# Patient Record
Sex: Female | Born: 1955 | Race: White | Hispanic: No | Smoking: Never smoker
Health system: Southern US, Community
[De-identification: ages and names within clinical notes are randomized; demographics above are authoritative.]

## PROBLEM LIST (undated history)

## (undated) DIAGNOSIS — I447 Left bundle-branch block, unspecified: Secondary | ICD-10-CM

## (undated) DIAGNOSIS — K635 Polyp of colon: Secondary | ICD-10-CM

## (undated) DIAGNOSIS — C55 Malignant neoplasm of uterus, part unspecified: Secondary | ICD-10-CM

## (undated) HISTORY — DX: Malignant neoplasm of uterus, part unspecified: C55

## (undated) HISTORY — DX: Polyp of colon: K63.5

## (undated) HISTORY — DX: Left bundle-branch block, unspecified: I44.7

---
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
## 2006-06-30 ENCOUNTER — Ambulatory Visit: Payer: Self-pay | Admitting: Obstetrics and Gynecology

## 2006-08-05 ENCOUNTER — Ambulatory Visit: Payer: Self-pay | Admitting: Obstetrics and Gynecology

## 2008-11-01 DIAGNOSIS — K635 Polyp of colon: Secondary | ICD-10-CM

## 2008-11-01 HISTORY — DX: Polyp of colon: K63.5

## 2009-07-10 ENCOUNTER — Ambulatory Visit: Payer: Self-pay | Admitting: Gastroenterology

## 2010-11-01 DIAGNOSIS — I447 Left bundle-branch block, unspecified: Secondary | ICD-10-CM

## 2010-11-01 HISTORY — DX: Left bundle-branch block, unspecified: I44.7

## 2010-11-01 HISTORY — PX: ABDOMINAL HYSTERECTOMY: SHX81

## 2012-10-31 ENCOUNTER — Telehealth: Payer: Self-pay | Admitting: Family Medicine

## 2012-10-31 NOTE — Telephone Encounter (Signed)
Dr. Dayton Martes, Patient says she spoke to you regarding becoming a new patient and per Jacki Cones we needed to verify that it's ok to add this patient in any slot other than a new patient slot.  Patient thinks she's having gallbladder issues and was hoping to be seen some time next week.  Please advise.  Thanks,  [employer]

## 2012-10-31 NOTE — Telephone Encounter (Signed)
Yes I spoke to a nurse at [facility] about seeing her mother ASAP.  If this is her, then yes please work in for a 30 min appt.

## 2012-11-13 ENCOUNTER — Ambulatory Visit (INDEPENDENT_AMBULATORY_CARE_PROVIDER_SITE_OTHER): Payer: Self-pay | Admitting: Family Medicine

## 2012-11-13 ENCOUNTER — Encounter: Payer: Self-pay | Admitting: Family Medicine

## 2012-11-13 VITALS — BP 140/84 | HR 80 | Temp 98.2°F | Ht 65.0 in | Wt 200.0 lb

## 2012-11-13 DIAGNOSIS — R1011 Right upper quadrant pain: Secondary | ICD-10-CM

## 2012-11-13 NOTE — Progress Notes (Signed)
  Subjective:    Patient ID: Erin Padilla, female    DOB: [birth year], 57 y.o.   MRN: [id number]  HPI  57 yo pt with h/o of endometrial CA s/p hysterectomy here to establish care and for RUQ pain.  She has no insurance and would like this to be a limited visit.  She is very active.  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  Past 2 months, intermittent RUQ dull, achy pain.  Does not seem to be worsened by food.  Can be worsened by sodas.   No associated nausea, vomiting, fever, blood in stool or changes in her bowel habits.  Patient Active Problem List  Diagnosis  . RUQ pain   Past Medical History  Diagnosis Date  . Colon polyp 2010  . Uterine cancer     did not penetrate wall   Past Surgical History  Procedure Date  . Abdominal hysterectomy 2012  . Tonsillectomy 1970   History  Substance Use Topics  . Smoking status: Former Games developer  . Smokeless tobacco: Never Used  . Alcohol Use: No   No family history on file. No Known Allergies No current outpatient prescriptions on file prior to visit.   The PMH, PSH, Social History, Family History, Medications, and allergies have been reviewed in [facility], and have been updated if relevant.   Review of Systems    See HPI Objective:   Physical Exam BP 140/84  Pulse 80  Temp 98.2 F (36.8 C) (Oral)  Ht 5\' 5"  (1.651 m)  Wt 200 lb (90.719 kg)  BMI 33.28 kg/m2 Gen:  Alert pleasant, NAD Abd:  Soft, NTTP Pos BS Psych:  Good eye contact.  Not anxious or depressed appearing.     Assessment & Plan:   1. RUQ pain  Comprehensive metabolic panel, Lipase, US Abdomen Limited RUQ   New- persistent.  Will start with labs and right upper quadrant ultrasound- I suspect biliary colic although not classic presentation. The patient indicates understanding of these issues and agrees with the plan.

## 2012-11-13 NOTE — Patient Instructions (Addendum)
After you to the lab, please stop by to see Shirlee Limerick on your way out to set up your ultrasound. We will call you with your lab results.  Please ask for patient assistance information on your way out.

## 2012-11-14 LAB — COMPREHENSIVE METABOLIC PANEL
ALT: 14 U/L (ref 0–35)
AST: 22 U/L (ref 0–37)
Alkaline Phosphatase: 70 U/L (ref 39–117)
BUN: 13 mg/dL (ref 6–23)
Creatinine, Ser: 0.9 mg/dL (ref 0.4–1.2)
Potassium: 3.9 mEq/L (ref 3.5–5.1)

## 2012-11-21 ENCOUNTER — Ambulatory Visit: Payer: Self-pay | Admitting: Family Medicine

## 2012-11-21 ENCOUNTER — Telehealth: Payer: Self-pay

## 2012-11-21 ENCOUNTER — Encounter: Payer: Self-pay | Admitting: Family Medicine

## 2012-11-21 NOTE — Telephone Encounter (Signed)
Erin Padilla with [facility] request order for US abdomen limited RUQ faxed to [phone]. Order faxed and Erin Padilla notified.

## 2012-11-22 ENCOUNTER — Encounter: Payer: Self-pay | Admitting: Family Medicine

## 2013-10-08 IMAGING — US US ABDOMEN LIMITED SLG ORGAN/ASCITES
1 series · 14 of 25 positions shown · non-contrast
Comparison: none

REASON FOR EXAM: RUQ pain eval gallbladder
COMMENTS:

PROCEDURE:     JOHNATHAN - JOHNATHAN ABDOMEN LTD 1 ORGAN OR QUAD  - November 21, 2012  [DATE]
RESULT:     History: Pain.
Comparison Study: No prior.

[Series 1: us abdomen limited slg organ/ascites · 0.31mm/px · 14 of 62 slices shown]
[im 1/62]
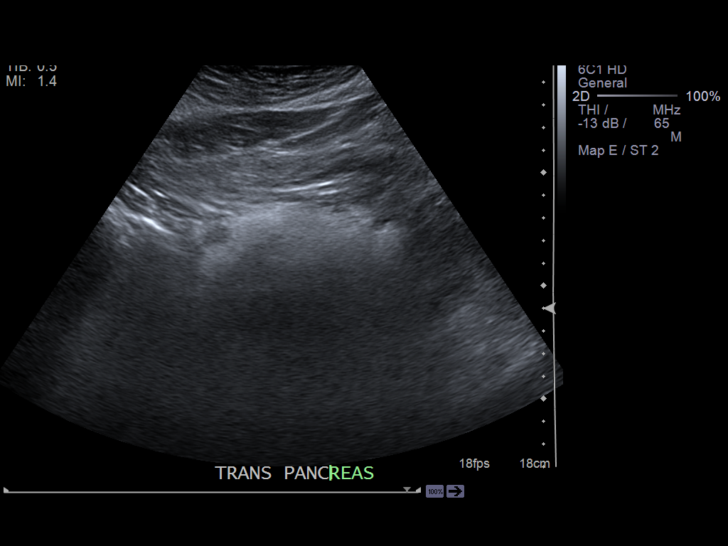
[im 6/62]
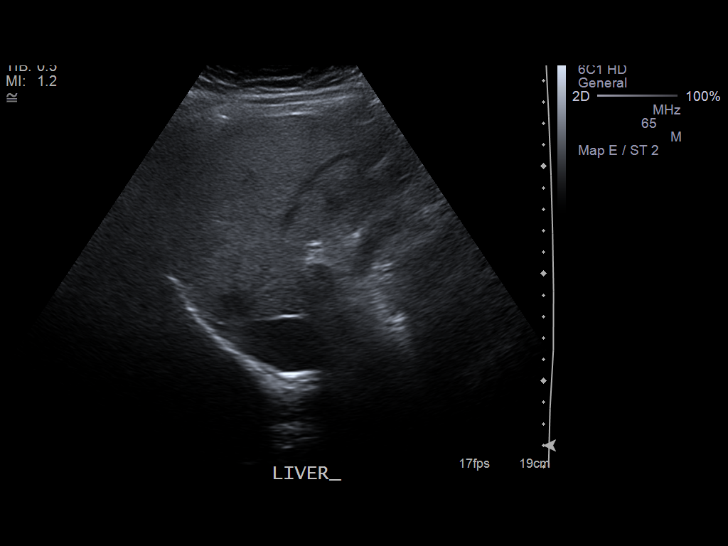
[im 11/62]
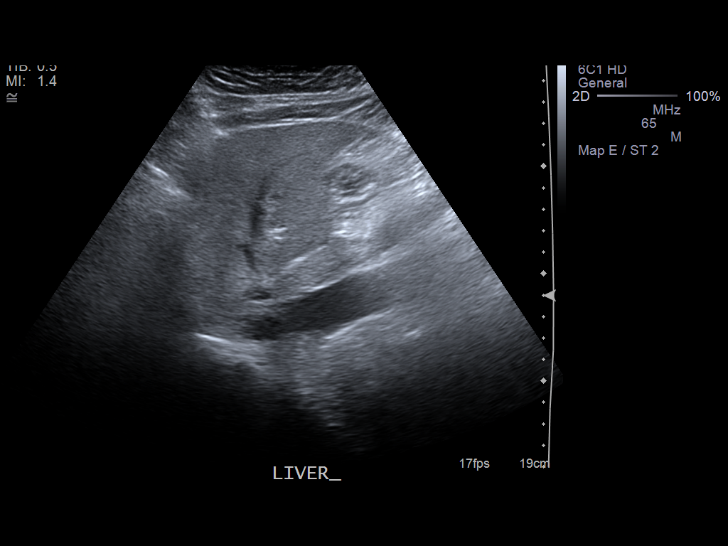
[im 16/62]
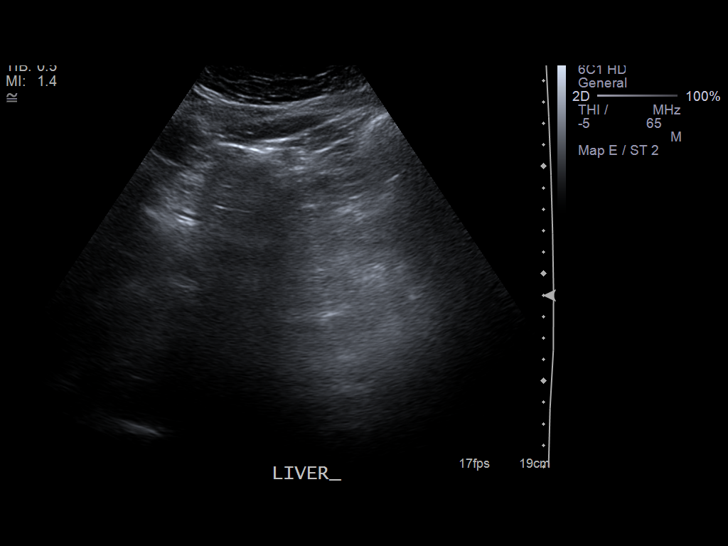
[im 21/62]
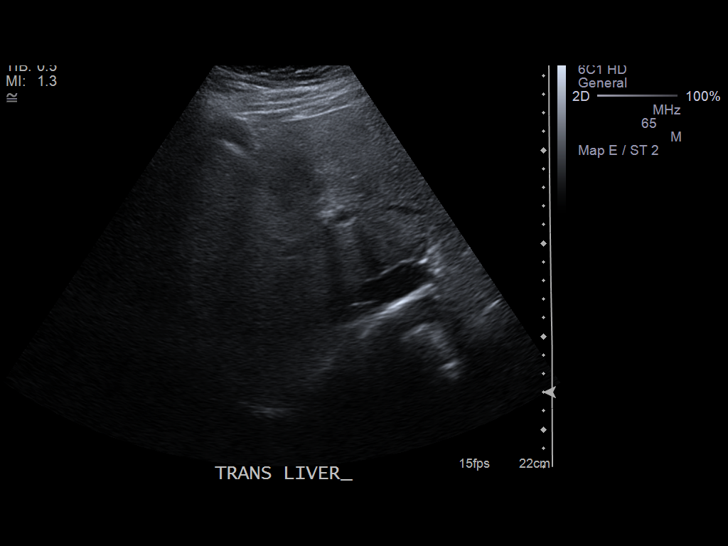
[im 23/62]
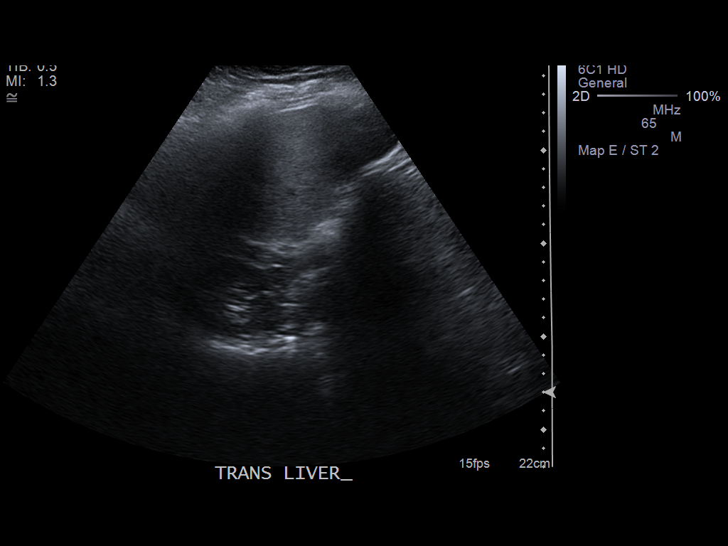
[im 28/62]
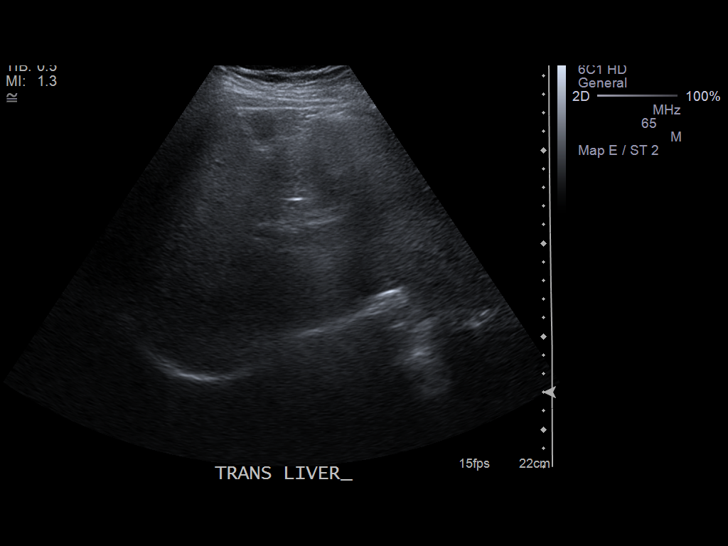
[im 34/62]
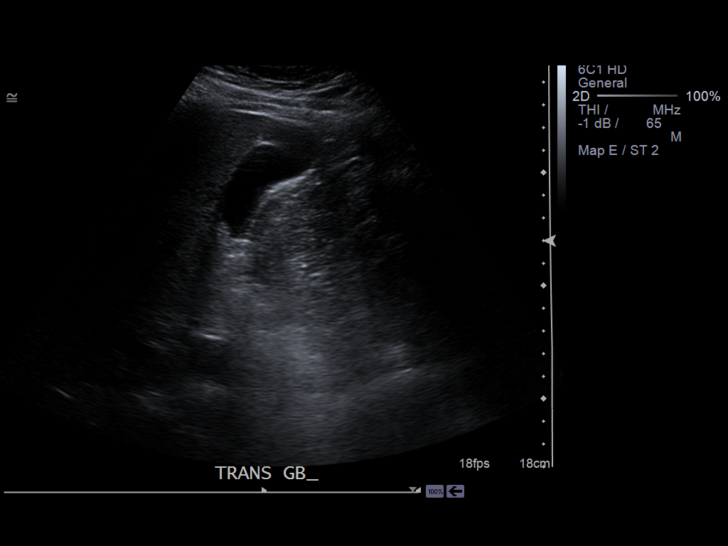
[im 39/62]
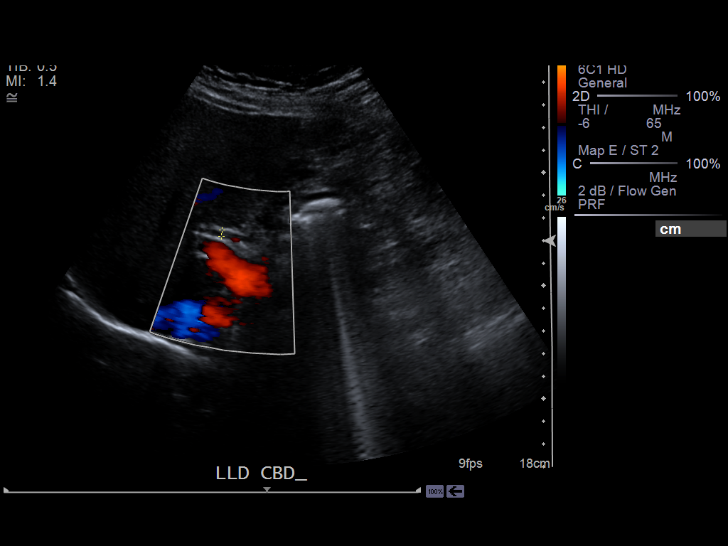
[im 41/62]
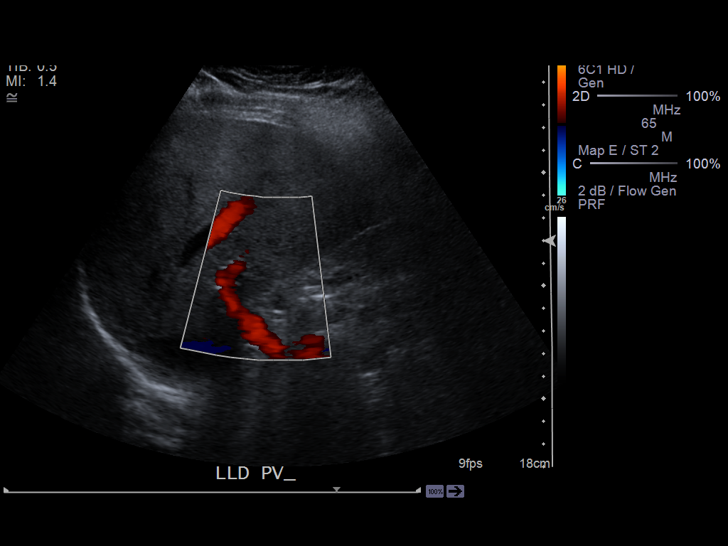
[im 46/62]
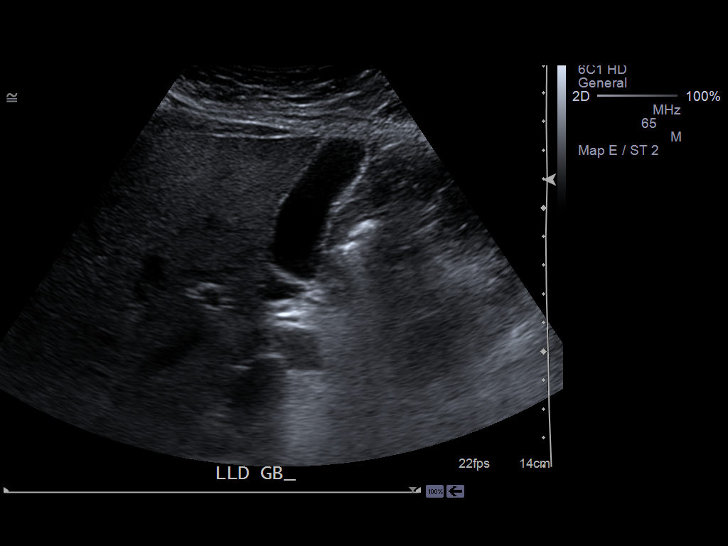
[im 51/62]
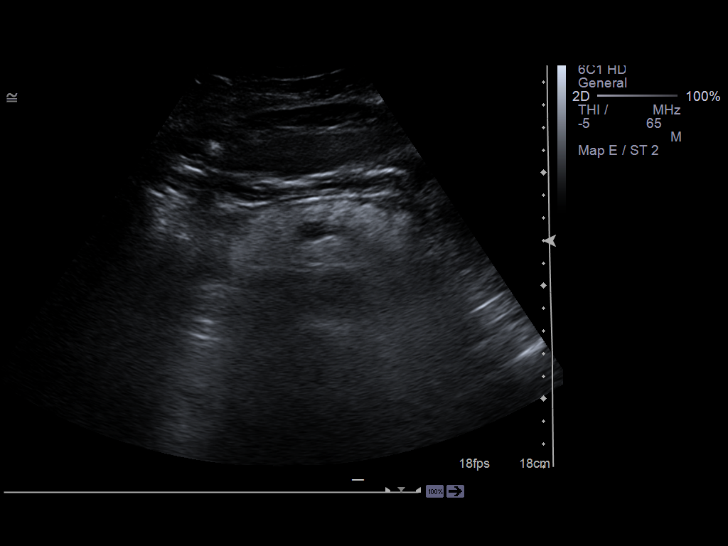
[im 56/62]
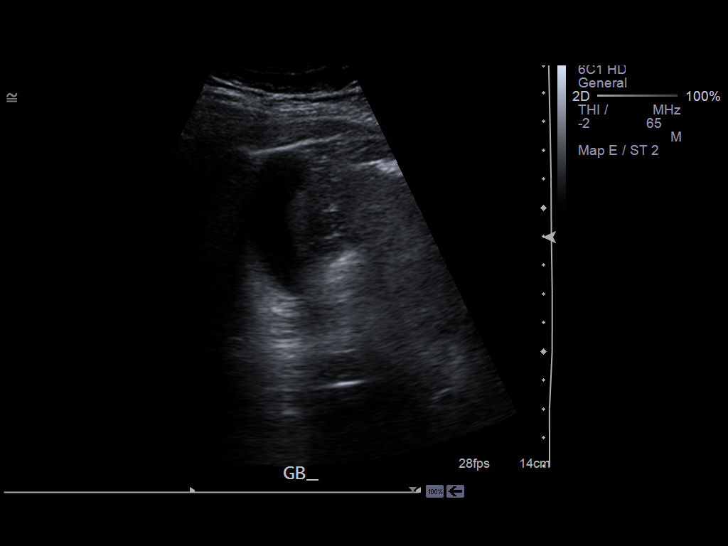
[im 62/62]
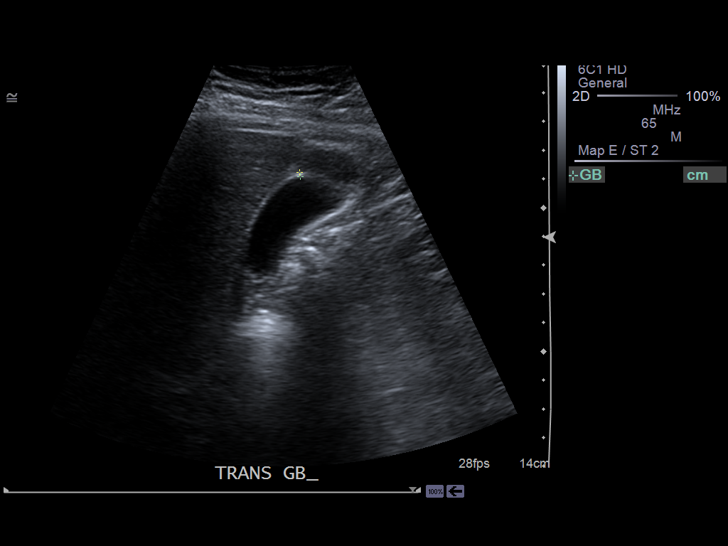

[14 of 25 positions shown; findings below may reference images not displayed]

FINDINGS: Gallbladder normal. Gallbladder wall thickness 1.5 mm. Common bile
duct diameter 2.8 mm. Negative Murphy's sign. Pancreas normal visualized.
IMPRESSION: Negative exam.

## 2014-01-17 ENCOUNTER — Ambulatory Visit (INDEPENDENT_AMBULATORY_CARE_PROVIDER_SITE_OTHER): Payer: Self-pay | Admitting: Family Medicine

## 2014-01-17 ENCOUNTER — Encounter: Payer: Self-pay | Admitting: Family Medicine

## 2014-01-17 VITALS — BP 132/82 | HR 56 | Temp 97.5°F | Ht 63.75 in | Wt 204.5 lb

## 2014-01-17 DIAGNOSIS — I447 Left bundle-branch block, unspecified: Secondary | ICD-10-CM | POA: Insufficient documentation

## 2014-01-17 DIAGNOSIS — Z01818 Encounter for other preprocedural examination: Secondary | ICD-10-CM | POA: Insufficient documentation

## 2014-01-17 NOTE — Progress Notes (Signed)
   Subjective:   Patient ID: Erin Padilla, female    DOB: 12-07-1955, 58 y.o.   MRN: [id number]  Erin Padilla is a pleasant 58 y.o. year old female who presents to clinic today with surgical clearance  on 01/17/2014  HPI: Scheduled to have right knee arthroscopic surgery.  Needs a TKR but she would like to start out with something less invasive first.  Had a hysterectomy in 09/2011- during preop, LBBB was found.  She was still cleared for surgery.  Has never had any symptoms.  Very physically active and never has dizziness, CP, SOB, DOE, orthopnea or fatigue.  Did not have any issues with anesthesia.  Former smoker.  Patient Active Problem List   Diagnosis Date Noted  . LBBB (left bundle branch block) 01/17/2014  . Pre-operative clearance 01/17/2014   Past Medical History  Diagnosis Date  . Colon polyp 2010  . Uterine cancer     did not penetrate wall  . LBBB (left bundle branch block)    Past Surgical History  Procedure Laterality Date  . Abdominal hysterectomy  2012  . Tonsillectomy  1970   History  Substance Use Topics  . Smoking status: Former Research scientist (life sciences)  . Smokeless tobacco: Never Used  . Alcohol Use: No   No family history on file. No Known Allergies No current outpatient prescriptions on file prior to visit.   No current facility-administered medications on file prior to visit.   The PMH, PSH, Social History, Family History, Medications, and allergies have been reviewed in [facility], and have been updated if relevant.   Review of Systems    See HPI +right knee pain otherwise unremarkable Objective:    BP 132/82  Pulse 56  Temp(Src) 97.5 F (36.4 C) (Oral)  Ht 5' 3.75" (1.619 m)  Wt 204 lb 8 oz (92.761 kg)  BMI 35.39 kg/m2  SpO2 98%   Physical Exam  Constitutional: She is oriented to person, place, and time. She appears well-developed and well-nourished. No distress.  HENT:  Head: Normocephalic and atraumatic.  Cardiovascular: Normal rate and regular  rhythm.   Pulmonary/Chest: Effort normal and breath sounds normal.  Neurological: She is alert and oriented to person, place, and time.  Skin: Skin is warm and dry.  Psychiatric: She has a normal mood and affect. Her behavior is normal. Thought content normal.      EKG-LBBB with bradycardia    Assessment & Plan:   Preoperative clearance - Plan: EKG 12-Lead  LBBB (left bundle branch block) - Plan: Ambulatory referral to Cardiology  Pre-operative clearance No Follow-up on file.

## 2014-01-17 NOTE — Progress Notes (Signed)
Pre visit review using our clinic review tool, if applicable. No additional management support is needed unless otherwise documented below in the visit note. 

## 2014-01-17 NOTE — Patient Instructions (Signed)
It was great to see you. We will fax my note and EKG to your surgeon.  We will call you with your heart doctor appointment.

## 2014-01-17 NOTE — Assessment & Plan Note (Signed)
Asymptomatic and per pt, present on EKG in 2012.  Will refer to cardiology for evaluation at some point- does not need to be prior to surgery since she had no issues with general anesthesia in past and she is quite physically active. The patient indicates understanding of these issues and agrees with the plan.

## 2014-01-17 NOTE — Assessment & Plan Note (Signed)
Cleared for surgery. See above.

## 2014-01-23 ENCOUNTER — Ambulatory Visit (INDEPENDENT_AMBULATORY_CARE_PROVIDER_SITE_OTHER): Payer: Self-pay | Admitting: Cardiology

## 2014-01-23 ENCOUNTER — Encounter: Payer: Self-pay | Admitting: Cardiology

## 2014-01-23 VITALS — BP 153/89 | HR 67 | Ht 63.0 in | Wt 205.8 lb

## 2014-01-23 DIAGNOSIS — I429 Cardiomyopathy, unspecified: Secondary | ICD-10-CM

## 2014-01-23 DIAGNOSIS — Z01818 Encounter for other preprocedural examination: Secondary | ICD-10-CM

## 2014-01-23 DIAGNOSIS — I447 Left bundle-branch block, unspecified: Secondary | ICD-10-CM

## 2014-01-23 DIAGNOSIS — I428 Other cardiomyopathies: Secondary | ICD-10-CM

## 2014-01-23 NOTE — Patient Instructions (Signed)
Your physician has requested that you have an echocardiogram. Echocardiography is a painless test that uses sound waves to create images of your heart. It provides your doctor with information about the size and shape of your heart and how well your heart's chambers and valves are working. This procedure takes approximately one hour. There are no restrictions for this procedure.  Your physician recommends that you schedule a follow-up appointment in:  After ECHO

## 2014-01-24 ENCOUNTER — Telehealth: Payer: Self-pay

## 2014-01-24 ENCOUNTER — Encounter: Payer: Self-pay | Admitting: Cardiology

## 2014-01-24 DIAGNOSIS — I429 Cardiomyopathy, unspecified: Secondary | ICD-10-CM | POA: Insufficient documentation

## 2014-01-24 NOTE — Progress Notes (Signed)
PATIENT: Erin Padilla MRN: [id number] DOB: 01-Mar-1956 PCP: Arnette Norris, MD  Clinic Note: Chief Complaint  Patient presents with  . OTHER    Ref by Deborra Medina due to LBBB c/o no complaints. Meds reviewed verbally with pt.   HPI: Erin Padilla is a 58 y.o. female with a PMH below who presents today for evaluation of LBBB on EKG.  Interval History: [name] is essentially asymptomatic and overall for cardiac standpoint. She has had a Left Bundle Branch Block diagnosed in 2012 at for preop evaluation [facility]. No evaluation was done after that. Up until the time that she started being bothered by her knee, she was quite active and denies any major cardiac symptoms.  From a cardiac standpoint: No chest pain or shortness of breath with rest or exertion. No PND, orthopnea or edema. No palpitations, lightheadedness, dizziness, weakness or syncope/near syncope. No TIA/amaurosis fugax symptoms. No melena, hematochezia hematuria. No claudication    Past Medical History  Diagnosis Date  . Colon polyp 2010  . Uterine cancer     did not penetrate wall  . LBBB (left bundle branch block) 2012    Prior Cardiac Evaluation and Past Surgical History: Past Surgical History  Procedure Laterality Date  . Abdominal hysterectomy  2012  . Tonsillectomy  1970    No Known Allergies  Current Outpatient Prescriptions  Medication Sig Dispense Refill  . b complex vitamins tablet Take 1 tablet by mouth daily.      Marland Kitchen ibuprofen (ADVIL,MOTRIN) 800 MG tablet Take 800 mg by mouth 2 (two) times daily as needed.       No current facility-administered medications for this visit.    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   Married.   Never smoked. No EtOH.   Prior to worsening Knee pain from OA - would walk & exercise regularly. 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     Family History: family history includes Arrhythmia in her father; Hypertension in her  father.  ROS: A comprehensive Review of Systems - Negative except Lots of stress at work. Rare palpitations. significant right knee pain that notably limits activity.   PHYSICAL EXAM BP 153/89  Pulse 67  Ht 5\' 3"  (1.6 m)  Wt 205 lb 12 oz (93.328 kg)  BMI 36.46 kg/m2 General appearance: alert, cooperative, appears stated age, no distress, moderately obese and Well-nourished & well-groomed. Pleasant mood and affect. Answers questions appropriately HEENT: [place]/AT, EOMI, MMM, anicteric sclera Neck: no adenopathy, no carotid bruit, no JVD and thyroid not enlarged, symmetric, no tenderness/mass/nodules Lungs: clear to auscultation bilaterally, normal percussion bilaterally and Nonlabored, good air movement Heart: RRR, normal S1, split S2. No M./R./G. Abdomen: soft, non-tender; bowel sounds normal; no masses,  no organomegaly Extremities: extremities normal, atraumatic, no cyanosis or edema and no edema, redness or tenderness in the calves or thighs Pulses: 2+ and symmetric Skin: Skin color, texture, turgor normal. No rashes or lesions Neurologic: Alert and oriented X 3, normal strength and tone. Normal symmetric reflexes. Normal coordination and gait   Adult ECG Report  Rate:  67 ;  Rhythm: normal sinus rhythm  QRS Axis:  -30 (LAD) ;  PR Interval:  Normal ;  QRS Duration:  Normal ; QTc:  Normal  Voltages:  Normal  Conduction Disturbances: left bundle branch block  Other Abnormalities: none   Narrative Interpretation:  Left Bundle Branch Block with Left Axis Deviation..  Recent Labs:  No current labs  ASSESSMENT / PLAN:  Relatively stable middle-age woman with a long-term diagnosis of left bundle branch block. Here for cardiology evaluation. She is asymptomatic for cardiac standpoint. We'll simply evaluate for any structural and amount is with a transthoracic echocardiogram. Only if this shows any significant wall motion abnormalities or significantly reduced EF, would we consider further  evaluation with Myoview. Basically I want to evaluate if there is any potential cardiomyopathy leading to the bombers clot versus potential he underwent block mediated cardiomyopathy.  In no way he should this echocardiogram affect her proceeding with knee surgery which the low risk surgery from a cardiac standpoint.  LBBB (left bundle branch block) Asymptomatic. Presence of 2012. No prior issues with general anesthesia.  Plan: 2-D echocardiogram   Cardiomyopathy No clear diagnosis as such, performing echocardiogram to assure no  abnormal findings related to Left Bundle Branch Block.   Pre-operative clearance There is no cardiac reason for her to not proceed with surgery. This echocardiogram is simply for her documentation purposes to confirm or deny the presence of structural anomaly.   Orders Placed This Encounter  Procedures  . EKG 12-Lead    Order Specific Question:  Where should this test be performed    Answer:  [facility]  . 2D Echocardiogram without contrast    Standing Status: Future     Number of Occurrences:      Standing Expiration Date: 01/23/2015    Order Specific Question:  Type of Echo    Answer:  Complete    Order Specific Question:  Where should this test be performed    Answer:  [facility]    Order Specific Question:  Reason for exam-Echo    Answer:  Cardiomyopathy-Unspecified  425.9   Meds ordered this encounter  Medications  . b complex vitamins tablet    Sig: Take 1 tablet by mouth daily.    Followup:  Roughly 1 months  DAVID W. Ellyn Hack, M.D., M.S. Interventional Cardiolgy [facility]

## 2014-01-24 NOTE — Telephone Encounter (Signed)
L MOM to let patient know the cost of her echocardiogram. With insurance $900, self pay would be half. This is just a estimate. It may be more or less.

## 2014-01-24 NOTE — Assessment & Plan Note (Signed)
No clear diagnosis as such, performing echocardiogram to assure no  abnormal findings related to Left Bundle Branch Block.

## 2014-01-24 NOTE — Assessment & Plan Note (Signed)
There is no cardiac reason for her to not proceed with surgery. This echocardiogram is simply for her documentation purposes to confirm or deny the presence of structural anomaly.

## 2014-01-24 NOTE — Assessment & Plan Note (Signed)
Asymptomatic. Presence of 2012. No prior issues with general anesthesia.  Plan: 2-D echocardiogram

## 2014-01-29 ENCOUNTER — Other Ambulatory Visit (INDEPENDENT_AMBULATORY_CARE_PROVIDER_SITE_OTHER): Payer: Self-pay

## 2014-01-29 ENCOUNTER — Other Ambulatory Visit: Payer: Self-pay

## 2014-01-29 DIAGNOSIS — I429 Cardiomyopathy, unspecified: Secondary | ICD-10-CM

## 2014-01-29 DIAGNOSIS — I447 Left bundle-branch block, unspecified: Secondary | ICD-10-CM

## 2014-01-29 DIAGNOSIS — R9431 Abnormal electrocardiogram [ECG] [EKG]: Secondary | ICD-10-CM

## 2014-02-04 NOTE — Progress Notes (Signed)
Quick Note:  Echo results: Good news: Essentially normal echocardiogram with low-normal pump function mostly related to abnormal septal motion from the LBBB.  Normal valve function. No signs to suggest heart attack.. EF: 50-55%. No regional wall motion abnormalities  Probably does not need further CAD evaluation in the absence of symptoms.  Leonie Man, MD    ______

## 2014-02-06 ENCOUNTER — Ambulatory Visit: Payer: Self-pay | Admitting: Cardiology

## 2014-02-14 ENCOUNTER — Ambulatory Visit: Payer: Self-pay | Admitting: Orthopedic Surgery

## 2014-02-21 ENCOUNTER — Encounter: Payer: Self-pay | Admitting: Orthopedic Surgery

## 2014-03-01 ENCOUNTER — Encounter: Payer: Self-pay | Admitting: Orthopedic Surgery

## 2014-04-01 ENCOUNTER — Encounter: Payer: Self-pay | Admitting: Orthopedic Surgery

## 2014-05-01 ENCOUNTER — Encounter: Payer: Self-pay | Admitting: Orthopedic Surgery

## 2014-07-02 ENCOUNTER — Encounter: Payer: Self-pay | Admitting: Family Medicine

## 2014-08-05 ENCOUNTER — Ambulatory Visit: Payer: Self-pay | Admitting: Family Medicine

## 2014-08-06 ENCOUNTER — Ambulatory Visit (INDEPENDENT_AMBULATORY_CARE_PROVIDER_SITE_OTHER): Payer: Self-pay | Admitting: Family Medicine

## 2014-08-06 ENCOUNTER — Encounter: Payer: Self-pay | Admitting: Family Medicine

## 2014-08-06 VITALS — BP 130/88 | HR 59 | Temp 97.8°F | Wt 203.5 lb

## 2014-08-06 DIAGNOSIS — N644 Mastodynia: Secondary | ICD-10-CM

## 2014-08-06 NOTE — Patient Instructions (Signed)
Great to see you. Please say hi to [facility] for me. Stop by to see Rosaria Ferries on your way out to set up your ultrasound.

## 2014-08-06 NOTE — Progress Notes (Signed)
Pre visit review using our clinic review tool, if applicable. No additional management support is needed unless otherwise documented below in the visit note. 

## 2014-08-06 NOTE — Progress Notes (Signed)
Subjective:   Patient ID: Erin Padilla, female    DOB: 1956/09/16, 58 y.o.   MRN: [id number]  Erin Padilla is a pleasant 58 y.o. year old female who presents to clinic today with Breast Pain  on 08/06/2014  HPI:  Has been going to the gym and doing more weight lifting and weight machines since her knee surgery in July. Not sure if she "pulled something" but for past 2 months, she is having intermittent right sided breast pain. Usually only feels it when she is wearing a bra- pain is dull. Has not noticed any masses or other changes in her breasts or nipples. Not worsened by deep breaths or coughing.  Neg mammogram 07/01/14- reviewed in [facility]. Current Outpatient Prescriptions on File Prior to Visit  Medication Sig Dispense Refill  . b complex vitamins tablet Take 1 tablet by mouth daily.      Marland Kitchen ibuprofen (ADVIL,MOTRIN) 800 MG tablet Take 800 mg by mouth 2 (two) times daily as needed.       No current facility-administered medications on file prior to visit.    No Known Allergies  Past Medical History  Diagnosis Date  . Colon polyp 2010  . Uterine cancer     did not penetrate wall  . LBBB (left bundle branch block) 2012    Past Surgical History  Procedure Laterality Date  . Abdominal hysterectomy  2012  . Tonsillectomy  1970    Family History  Problem Relation Age of Onset  . Arrhythmia Father   . Hypertension Father     History   Social History  . Marital Status: Married    Spouse Name: N/A    Number of Children: N/A  . Years of Education: N/A   Occupational History  . Not on file.   Social History Main Topics  . Smoking status: Never Smoker   . Smokeless tobacco: Never Used  . Alcohol Use: No  . Drug Use: No  . Sexual Activity: Not on file   Other Topics Concern  . Not on file   Social History Narrative   Logen Fowle, RN is patient's daughter in Sports coach.   Married.   Never smoked. No EtOH.   Prior to worsening Knee pain from OA - would walk &  exercise regularly. 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    The PMH, PSH, Social History, Family History, Medications, and allergies have been reviewed in [facility], and have been updated if relevant.   Review of Systems See HPI No fevers No night sweats No fatigue    Objective:    BP 130/88  Pulse 59  Temp(Src) 97.8 F (36.6 C) (Oral)  Wt 203 lb 8 oz (92.307 kg)  SpO2 95%   Physical Exam   General:  Well-developed,well-nourished,in no acute distress; alert,appropriate and cooperative throughout examination Head:  normocephalic and atraumatic.   Eyes:  vision grossly intact, pupils equal, pupils round, and pupils reactive to light.   Ears:  R ear normal and L ear normal.   Nose:  no external deformity.   Mouth:  good dentition.   Neck:  No deformities, masses, or tenderness noted. Breasts:  No mass, nodules, thickening, tenderness, bulging, retraction, inflamation, nipple discharge or skin changes noted.   No tenderness to palpation over lateral breast where she is complaining of pain Extremities:  No clubbing, cyanosis, edema, or deformity noted with normal full range of motion of all joints.   Neurologic:  alert & oriented X3  and gait normal.   Skin:  Intact without suspicious lesions or rashes Axillary Nodes:  No palpable lymphadenopathy Psych:  Cognition and judgment appear intact. Alert and cooperative with normal attention span and concentration. No apparent delusions, illusions, hallucinations       Assessment & Plan:   Breast pain, right - Plan: US BREAST COMPLETE UNI RIGHT INC AXILLA No Follow-up on file.

## 2014-08-06 NOTE — Assessment & Plan Note (Signed)
New- Exam not consistent with costochondritis or pleurisy- no pain on palpation or with deep breaths. Reassuring normal mammogram in 06/2014 and exam today but given persistent nature of pain without another explanation, I did suggest further imaging.  She is self pay- will start with ultrasound of right breast and axilla. The patient indicates understanding of these issues and agrees with the plan.

## 2014-08-07 ENCOUNTER — Other Ambulatory Visit: Payer: Self-pay | Admitting: Family Medicine

## 2014-08-07 DIAGNOSIS — N644 Mastodynia: Secondary | ICD-10-CM

## 2014-08-07 NOTE — Addendum Note (Signed)
Addended by: Lucille Passy on: 08/07/2014 11:19 AM   Modules accepted: Orders

## 2014-08-21 ENCOUNTER — Encounter: Payer: Self-pay | Admitting: Family Medicine

## 2015-02-22 NOTE — Op Note (Signed)
PATIENT NAME:  RONASIA, ISOLA MR#:  [id number] DATE OF BIRTH:  1956/06/18  DATE OF PROCEDURE:  02/14/2014  PREOPERATIVE DIAGNOSIS: Right knee medial and lateral meniscal tears, degenerative joint disease.   POSTOPERATIVE DIAGNOSIS: Right knee medial and lateral meniscal tears, degenerative joint disease.   PROCEDURE PERFORMED: Arthroscopy right knee, partial medial meniscectomy, partial lateral meniscectomy, extensive synovectomy, chondroplasty.   SURGEON: Dawayne Patricia, MD  ASSISTANT: None.   ANESTHESIA: General.   TOURNIQUET TIME: Zero.  SURGICAL FINDINGS: Grade II chondromalacia undersurface of patella, grade III to IV chondromalacia trochlear groove, grade III chondromalacia medial femoral condyle, grade IV chondromalacia medial tibial plateau, grade II chondromalacia lateral femoral condyle, grade III chondromalacia lateral femoral plateau, large unstable tear posterior horn lateral meniscus, free edge tear posterior horn medial meniscus, extensive synovitis.   COMPLICATIONS: None.   INDICATIONS FOR PROCEDURE: Ms. Barbato is a 59 year old female who jumped off of a swing while playing with her grandchildren several months ago. She twisted her knee and has had symptoms since that time. Initially she had significant pain; however, the pain has since primarily subsided, but she continues to have repeated episode of locking and giving way of the knee. MRI demonstrated chondral changes as noted above, as well as a large tear posterior horn lateral meniscus and free edge tear posterior horn medial meniscus. Risks and benefits were discussed with the patient. The patient decided to proceed with surgical intervention.   DESCRIPTION OF PROCEDURE: Ms. Corso was identified in the preoperative holding area. The right lower extremity was marked as operative site. Consent form was reviewed and all questions were answered. The patient was brought into the operating room. General anesthesia was  administered. A tourniquet was applied to the lower extremity but was not inflated. Right lower extremity was prepared and draped in the usual sterile fashion. Timeout was performed identifying patient, procedure, laterality, confirming antibiotics, confirming images, confirming consent form, and confirming skin preparation.   Standard lateral viewing portal was made. Arthroscope was inserted into the patellofemoral articulation. Under direct visualization, superolateral outflow portal was made and cannula was inserted. Patellofemoral compartment was evaluated. Chondral changes as noted in surgical findings. Extensive synovitis. Attention was turned to the medial meniscus. Again, chondral changes as noted in surgical findings. The patient was found to have a large free edge tear of the posterior horn of the medial meniscus. Using a combination of meniscal biters and a shaver, a partial meniscectomy was carried out, leaving a nice stable edge. A probe was then used to evaluate the chondral surfaces for any evidence of loose fragments. A chondral shaver was inserted and used to smooth any potential unstable fragments. Attention was turned to the notch. ACL and PCL were both found to be intact. Attention was now turned to the lateral compartment. Chondral changes as noted in surgical findings. Posterior horn of the meniscus had a very large unstable tear that was flipped underneath the inferior surface of the meniscus. Probe was used to assess stability. The free piece easily flipped into the joint. Again, using a combination of meniscal biters and a shaver, this large unstable fragment was excised. At the junction of the posterior horn and the body of the lateral meniscus was significant fraying of the free edge of the meniscus. This was trimmed back to a stable edge. A probe was inserted to assess the chondral surfaces. There were multiple areas of unstable cartilage on the femoral condyle, which were gently  debrided using a chondral shaver.   Attention was  now turned back to the patellofemoral articulation. Probe was inserted onto the trochlear groove to assess for loose fragments. Again, many loose fragments of cartilage were noted. The patient chondral shaver was inserted to debride these back to a stable edge. The joint was copiously irrigated. The joint was drained. All instruments were removed. Portals were closed using 3-0 nylon suture. Sterile dressings were applied. The patient will follow up in the office next week.     ____________________________ Dawayne Patricia, MD sr:jcm D: 02/16/2014 07:59:18 ET T: 02/16/2014 19:00:32 ET JOB#: [id number]  cc: Dawayne Patricia, MD, <Dictator> Dawayne Patricia MD ELECTRONICALLY SIGNED 02/19/2014 11:57

## 2019-09-13 ENCOUNTER — Other Ambulatory Visit: Payer: Self-pay | Admitting: Family

## 2019-09-13 DIAGNOSIS — Z1231 Encounter for screening mammogram for malignant neoplasm of breast: Secondary | ICD-10-CM

## 2019-09-14 ENCOUNTER — Other Ambulatory Visit: Payer: Self-pay | Admitting: Family

## 2019-09-14 DIAGNOSIS — R011 Cardiac murmur, unspecified: Secondary | ICD-10-CM

## 2019-11-07 ENCOUNTER — Other Ambulatory Visit: Payer: Self-pay | Admitting: Family

## 2019-11-07 DIAGNOSIS — R1010 Upper abdominal pain, unspecified: Secondary | ICD-10-CM

## 2019-11-13 ENCOUNTER — Ambulatory Visit
Admission: RE | Admit: 2019-11-13 | Discharge: 2019-11-13 | Disposition: A | Discharge status: Home / Self Care | Payer: Medicare Other | Source: Ambulatory Visit | Attending: Family | Admitting: Family

## 2019-11-13 ENCOUNTER — Other Ambulatory Visit: Payer: Self-pay

## 2019-11-13 DIAGNOSIS — R1010 Upper abdominal pain, unspecified: Secondary | ICD-10-CM | POA: Diagnosis present

## 2019-12-31 ENCOUNTER — Other Ambulatory Visit: Payer: Self-pay

## 2019-12-31 ENCOUNTER — Telehealth: Payer: Self-pay

## 2019-12-31 DIAGNOSIS — Z1211 Encounter for screening for malignant neoplasm of colon: Secondary | ICD-10-CM

## 2019-12-31 NOTE — Telephone Encounter (Signed)
Gastroenterology Pre-Procedure Review  Request Date: Friday 01/18/20 Requesting Physician: Dr. Marius Ditch  PATIENT REVIEW QUESTIONS: The patient responded to the following health history questions as indicated:    1. Are you having any GI issues? yes (dull left side side pain comes and goes) 2. Do you have a personal history of Polyps? no 3. Do you have a family history of Colon Cancer or Polyps? no 4. Diabetes Mellitus? no 5. Joint replacements in the past 12 months?no 6. Major health problems in the past 3 months?no 7. Any artificial heart valves, MVP, or defibrillator?Cardiac History: Left Bundle Branch Block (Dr. Josefa Half)    [place]:    Patient reports the following regarding taking any anticoagulation/antiplatelet therapy:   Plavix, Coumadin, Eliquis, Xarelto, Lovenox, Pradaxa, Brilinta, or Effient? no Aspirin? no  Patient confirms/reports the following medications:  Current Outpatient Medications  Medication Sig Dispense Refill  . b complex vitamins tablet Take 1 tablet by mouth daily.    Marland Kitchen ibuprofen (ADVIL,MOTRIN) 800 MG tablet Take 800 mg by mouth 2 (two) times daily as needed.     No current facility-administered medications for this visit.    Patient confirms/reports the following allergies:  No Known Allergies  No orders of the defined types were placed in this encounter.   AUTHORIZATION INFORMATION Primary Insurance: 1D#: Group #:  Secondary Insurance: 1D#: Group #:  SCHEDULE INFORMATION: Date: Friday 01/18/20 Time: Location:[facility]

## 2020-01-16 ENCOUNTER — Other Ambulatory Visit: Payer: Self-pay

## 2020-01-16 ENCOUNTER — Other Ambulatory Visit
Admission: RE | Admit: 2020-01-16 | Discharge: 2020-01-16 | Disposition: A | Discharge status: Home / Self Care | Payer: Medicare Other | Source: Ambulatory Visit | Attending: Gastroenterology | Admitting: Gastroenterology

## 2020-01-16 DIAGNOSIS — Z20822 Contact with and (suspected) exposure to covid-19: Secondary | ICD-10-CM | POA: Insufficient documentation

## 2020-01-16 DIAGNOSIS — Z01812 Encounter for preprocedural laboratory examination: Secondary | ICD-10-CM | POA: Insufficient documentation

## 2020-01-16 LAB — SARS CORONAVIRUS 2 (TAT 6-24 HRS): SARS Coronavirus 2: NEGATIVE

## 2020-01-18 ENCOUNTER — Ambulatory Visit
Admission: RE | Admit: 2020-01-18 | Discharge: 2020-01-18 | Disposition: A | Discharge status: Home / Self Care | Payer: Medicare Other | Attending: Gastroenterology | Admitting: Gastroenterology

## 2020-01-18 ENCOUNTER — Encounter
Admission: RE | Disposition: A | Discharge status: Home / Self Care | Payer: Self-pay | Source: Home / Self Care | Attending: Gastroenterology

## 2020-01-18 ENCOUNTER — Ambulatory Visit: Payer: Medicare Other | Admitting: Registered Nurse

## 2020-01-18 ENCOUNTER — Encounter: Payer: Self-pay | Admitting: Gastroenterology

## 2020-01-18 DIAGNOSIS — Z86010 Personal history of colonic polyps: Secondary | ICD-10-CM | POA: Diagnosis not present

## 2020-01-18 DIAGNOSIS — D122 Benign neoplasm of ascending colon: Secondary | ICD-10-CM | POA: Insufficient documentation

## 2020-01-18 DIAGNOSIS — I509 Heart failure, unspecified: Secondary | ICD-10-CM | POA: Insufficient documentation

## 2020-01-18 DIAGNOSIS — K635 Polyp of colon: Secondary | ICD-10-CM

## 2020-01-18 DIAGNOSIS — Z1211 Encounter for screening for malignant neoplasm of colon: Secondary | ICD-10-CM | POA: Diagnosis present

## 2020-01-18 DIAGNOSIS — I447 Left bundle-branch block, unspecified: Secondary | ICD-10-CM | POA: Diagnosis not present

## 2020-01-18 DIAGNOSIS — Z8542 Personal history of malignant neoplasm of other parts of uterus: Secondary | ICD-10-CM | POA: Insufficient documentation

## 2020-01-18 HISTORY — PX: COLONOSCOPY WITH PROPOFOL: SHX5780

## 2020-01-18 SURGERY — COLONOSCOPY WITH PROPOFOL
Anesthesia: General

## 2020-01-18 MED ORDER — PROPOFOL 500 MG/50ML IV EMUL
INTRAVENOUS | Status: DC | PRN
  Administered 2020-01-18: 125 ug/kg/min via INTRAVENOUS

## 2020-01-18 MED ORDER — LIDOCAINE HCL (CARDIAC) PF 100 MG/5ML IV SOSY
PREFILLED_SYRINGE | INTRAVENOUS | Status: DC | PRN
  Administered 2020-01-18: 20 mg via INTRAVENOUS
  Administered 2020-01-18: 80 mg via INTRAVENOUS

## 2020-01-18 MED ORDER — SODIUM CHLORIDE 0.9 % IV SOLN: INTRAVENOUS | Status: DC

## 2020-01-18 MED ORDER — PROPOFOL 10 MG/ML IV BOLUS
INTRAVENOUS | Status: DC | PRN
  Administered 2020-01-18: 80 mg via INTRAVENOUS
  Administered 2020-01-18: 20 mg via INTRAVENOUS

## 2020-01-18 NOTE — Anesthesia Postprocedure Evaluation (Signed)
Anesthesia Post Note  Patient: Erin Padilla  Procedure(s) Performed: COLONOSCOPY WITH PROPOFOL (N/A )  Patient location during evaluation: Endoscopy Anesthesia Type: General Level of consciousness: awake and alert and oriented Pain management: pain level controlled Vital Signs Assessment: post-procedure vital signs reviewed and stable Respiratory status: spontaneous breathing Cardiovascular status: blood pressure returned to baseline Anesthetic complications: no     Last Vitals:  Vitals:   01/18/20 1207 01/18/20 1217  BP:  112/62  Pulse:    Resp:    Temp: (!) 35.9 C   SpO2:      Last Pain:  Vitals:   01/18/20 1237  TempSrc:   PainSc: 0-No pain                 Larin Weissberg

## 2020-01-18 NOTE — Op Note (Signed)
[facility] Patient Name: Erin Padilla Procedure Date: 01/18/2020 11:31 AM MRN: [id number] Account #: [id number] Date of Birth: 1956-04-06 Admit Type: Outpatient Age: 64 Room: [facility] ENDO ROOM 3 Gender: Female Note Status: Finalized Procedure:             Colonoscopy Indications:           Screening for colorectal malignant neoplasm, Last                         colonoscopy: September 2010 Providers:             Lin Landsman MD, MD Medicines:             Monitored Anesthesia Care Complications:         No immediate complications. Estimated blood loss:                         Minimal. Procedure:             Pre-Anesthesia Assessment:                        - Prior to the procedure, a History and Physical was                         performed, and patient medications and allergies were                         reviewed. The patient is competent. The risks and                         benefits of the procedure and the sedation options and                         risks were discussed with the patient. All questions                         were answered and informed consent was obtained.                         Patient identification and proposed procedure were                         verified by the physician, the nurse, the                         anesthesiologist, the anesthetist and the technician                         in the pre-procedure area in the procedure room in the                         endoscopy suite. Mental Status Examination: alert and                         oriented. Airway Examination: normal oropharyngeal                         airway and neck mobility. Respiratory Examination:  clear to auscultation. CV Examination: normal.                         Prophylactic Antibiotics: The patient does not require                         prophylactic antibiotics. Prior Anticoagulants: The   patient has taken no previous anticoagulant or                         antiplatelet agents. ASA Grade Assessment: III - A                         patient with severe systemic disease. After reviewing                         the risks and benefits, the patient was deemed in                         satisfactory condition to undergo the procedure. The                         anesthesia plan was to use monitored anesthesia care                         (MAC). Immediately prior to administration of                         medications, the patient was re-assessed for adequacy                         to receive sedatives. The heart rate, respiratory                         rate, oxygen saturations, blood pressure, adequacy of                         pulmonary ventilation, and response to care were                         monitored throughout the procedure. The physical                         status of the patient was re-assessed after the                         procedure.                        After obtaining informed consent, the colonoscope was                         passed under direct vision. Throughout the procedure,                         the patient's blood pressure, pulse, and oxygen                         saturations were monitored continuously. The  Colonoscope was introduced through the anus and                         advanced to the the cecum, identified by appendiceal                         orifice and ileocecal valve. The colonoscopy was                         performed with moderate difficulty due to significant                         looping and the patient's body habitus. Successful                         completion of the procedure was aided by applying                         abdominal pressure. The patient tolerated the                         procedure well. The quality of the bowel preparation                         was evaluated using the  BBPS [facility] Bowel Preparation                         Scale) with scores of: Right Colon = 3, Transverse                         Colon = 3 and Left Colon = 3 (entire mucosa seen well                         with no residual staining, small fragments of stool or                         opaque liquid). The total BBPS score equals 9. Findings:      The perianal and digital rectal examinations were normal. Pertinent       negatives include normal sphincter tone and no palpable rectal lesions.      A 12 mm polyp was found in the ascending colon. The polyp was flat.       Preparations were made for mucosal resection. Eleview was injected to       raise the lesion. Snare mucosal resection was performed. Resection and       retrieval were complete. To prevent bleeding after mucosal resection,       two hemostatic clips were successfully placed (MR conditional). There       was no bleeding at the end of the procedure. Estimated blood loss was       minimal.      The retroflexed view of the distal rectum and anal verge was normal and       showed no anal or rectal abnormalities. Impression:            - One 12 mm polyp in the ascending colon, removed with  mucosal resection. Resected and retrieved. Clips (MR                         conditional) were placed.                        - The distal rectum and anal verge are normal on                         retroflexion view.                        - Mucosal resection was performed. Resection and                         retrieval were complete. Recommendation:        - Discharge patient to home (with escort).                        - Resume previous diet today.                        - Continue present medications.                        - Await pathology results.                        - Repeat colonoscopy in 1-3 years for surveillance                         after piecemeal polypectomy. Procedure Code(s):     --- Professional  ---                        [phone], Colonoscopy, flexible; with endoscopic mucosal                         resection Diagnosis Code(s):     --- Professional ---                        Z12.11, Encounter for screening for malignant neoplasm                         of colon                        K63.5, Polyp of colon CPT copyright 2019 American Medical Association. All rights reserved. The codes documented in this report are preliminary and upon coder review may  be revised to meet current compliance requirements. Dr. Ulyess Mort Lin Landsman MD, MD 01/18/2020 12:05:25 PM This report has been signed electronically. Number of Addenda: 0 Note Initiated On: 01/18/2020 11:31 AM Scope Withdrawal Time: 0 hours 22 minutes 24 seconds  Total Procedure Duration: 0 hours 25 minutes 59 seconds  Estimated Blood Loss:  Estimated blood loss was minimal.      [facility]

## 2020-01-18 NOTE — Anesthesia Preprocedure Evaluation (Signed)
Anesthesia Evaluation  Patient identified by MRN, date of birth, ID band Patient awake    Reviewed: Allergy & Precautions, NPO status , Patient's Chart, lab work & pertinent test results  Airway Mallampati: III       Dental   Pulmonary neg pulmonary ROS,    Pulmonary exam normal        Cardiovascular +CHF  Normal cardiovascular exam     Neuro/Psych negative neurological ROS  negative psych ROS   GI/Hepatic Neg liver ROS,   Endo/Other  negative endocrine ROS  Renal/GU negative Renal ROS  Female GU complaint     Musculoskeletal negative musculoskeletal ROS (+)   Abdominal Normal abdominal exam  (+)   Peds negative pediatric ROS (+)  Hematology negative hematology ROS (+)   Anesthesia Other Findings Past Medical History: 2010: Colon polyp 2012: LBBB (left bundle branch block) No date: Uterine cancer (HCC)     Comment:  did not penetrate wall  Reproductive/Obstetrics                             Anesthesia Physical Anesthesia Plan  ASA: III  Anesthesia Plan: General   Post-op Pain Management:    Induction: Intravenous  PONV Risk Score and Plan:   Airway Management Planned: Nasal Cannula  Additional Equipment:   Intra-op Plan:   Post-operative Plan:   Informed Consent: I have reviewed the patients History and Physical, chart, labs and discussed the procedure including the risks, benefits and alternatives for the proposed anesthesia with the patient or authorized representative who has indicated his/her understanding and acceptance.     Dental advisory given  Plan Discussed with: CRNA and Surgeon  Anesthesia Plan Comments:         Anesthesia Quick Evaluation

## 2020-01-18 NOTE — H&P (Signed)
Cephas Darby, MD [address]  [place]  [place], [place] [address]  Main: [phone]  Fax: [phone] Pager: [phone]  Primary Care Physician:  Laneta Simmers, NP Primary Gastroenterologist:  Dr. Cephas Darby  Pre-Procedure History & Physical: HPI:  Erin Padilla is a 64 y.o. female is here for an colonoscopy.   Past Medical History:  Diagnosis Date  . Colon polyp 2010  . LBBB (left bundle branch block) 2012  . Uterine cancer ([place])    did not penetrate wall    Past Surgical History:  Procedure Laterality Date  . ABDOMINAL HYSTERECTOMY  2012  . TONSILLECTOMY  1970    Prior to Admission medications   Medication Sig Start Date End Date Taking? Authorizing Provider  b complex vitamins tablet Take 1 tablet by mouth daily.   Yes [provider]  ibuprofen (ADVIL,MOTRIN) 800 MG tablet Take 800 mg by mouth 2 (two) times daily as needed.   Yes [provider]    Allergies as of 12/31/2019  . (No Known Allergies)    Family History  Problem Relation Age of Onset  . Arrhythmia Father   . Hypertension Father     Social History   Socioeconomic History  . Marital status: Married    Spouse name: Not on file  . Number of children: Not on file  . Years of education: Not on file  . Highest education level: Not on file  Occupational History  . Not on file  Tobacco Use  . Smoking status: Never Smoker  . Smokeless tobacco: Never Used  Substance and Sexual Activity  . Alcohol use: No  . Drug use: No  . Sexual activity: Not on file  Other Topics Concern  . 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   Married.   Never smoked. No EtOH.   Prior to worsening Knee pain from OA - would walk & exercise regularly. 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    Social Determinants of Health   Financial Resource Strain:   . Difficulty of Paying Living Expenses:   Food Insecurity:   .  Worried About Charity fundraiser in the Last Year:   . Arboriculturist in the Last Year:   Transportation Needs:   . Film/video editor (Medical):   Marland Kitchen Lack of Transportation (Non-Medical):   Physical Activity:   . Days of Exercise per Week:   . Minutes of Exercise per Session:   Stress:   . Feeling of Stress :   Social Connections:   . Frequency of Communication with Friends and Family:   . Frequency of Social Gatherings with Friends and Family:   . Attends Religious Services:   . Active Member of Clubs or Organizations:   . Attends Archivist Meetings:   Marland Kitchen Marital Status:   Intimate Partner Violence:   . Fear of Current or Ex-Partner:   . Emotionally Abused:   Marland Kitchen Physically Abused:   . Sexually Abused:     Review of Systems: See HPI, otherwise negative ROS  Physical Exam: BP 138/78   Pulse 84   Temp 97.8 F (36.6 C) (Temporal)   Resp 16   Ht 5\' 3"  (1.6 m)   Wt 85.3 kg   SpO2 100%   BMI 33.30 kg/m  General:   Alert,  pleasant and cooperative in NAD Head:  Normocephalic and atraumatic. Neck:  Supple; no masses or thyromegaly. Lungs:  Clear throughout to auscultation.    Heart:  Regular rate and rhythm. Abdomen:  Soft, nontender and nondistended. Normal bowel sounds, without guarding, and without rebound.   Neurologic:  Alert and  oriented x4;  grossly normal neurologically.  Impression/Plan: Addaline Goch is here for an colonoscopy to be performed for colon cancer screening  Risks, benefits, limitations, and alternatives regarding  colonoscopy have been reviewed with the patient.  Questions have been answered.  All parties agreeable.   Sherri Sear, MD  01/18/2020, 11:26 AM

## 2020-01-18 NOTE — Transfer of Care (Signed)
Immediate Anesthesia Transfer of Care Note  Patient: Erin Padilla  Procedure(s) Performed: COLONOSCOPY WITH PROPOFOL (N/A )  Patient Location: Endoscopy Unit  Anesthesia Type:General  Level of Consciousness: drowsy  Airway & Oxygen Therapy: Patient Spontanous Breathing  Post-op Assessment: Report given to RN and Post -op Vital signs reviewed and stable  Post vital signs: Reviewed and stable  Last Vitals:  Vitals Value Taken Time  BP    Temp    Pulse    Resp    SpO2      Last Pain:  Vitals:   01/18/20 1058  TempSrc: Temporal  PainSc: 0-No pain         Complications: No apparent anesthesia complications

## 2020-01-21 ENCOUNTER — Encounter: Payer: Self-pay | Admitting: Gastroenterology

## 2020-01-21 ENCOUNTER — Encounter: Payer: Self-pay | Admitting: *Deleted

## 2020-01-21 LAB — SURGICAL PATHOLOGY

## 2020-09-29 IMAGING — US US ABDOMEN COMPLETE
1 series · 14 of 25 positions shown · non-contrast
Comparison: 11/21/2012

CLINICAL DATA: BILATERAL upper quadrant pain for 5 months

EXAM:
ABDOMEN ULTRASOUND COMPLETE

[Series 1: us abdomen complete · 0.17mm/px · 14 of 125 slices shown]
[im 1/125]
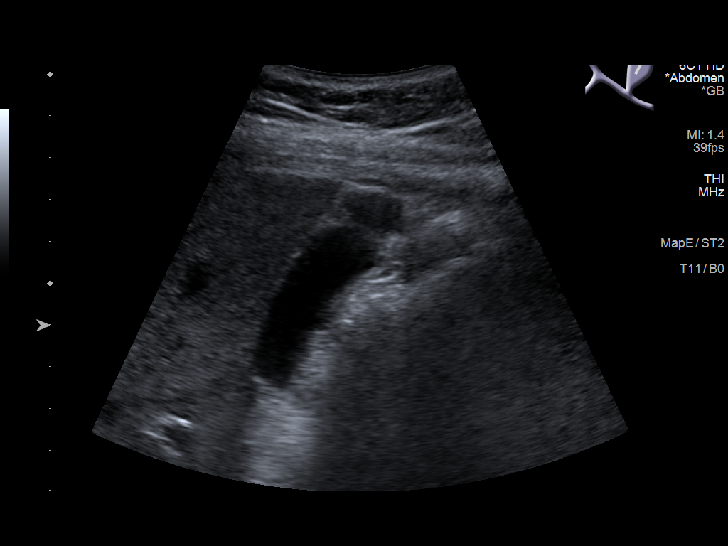
[im 11/125]
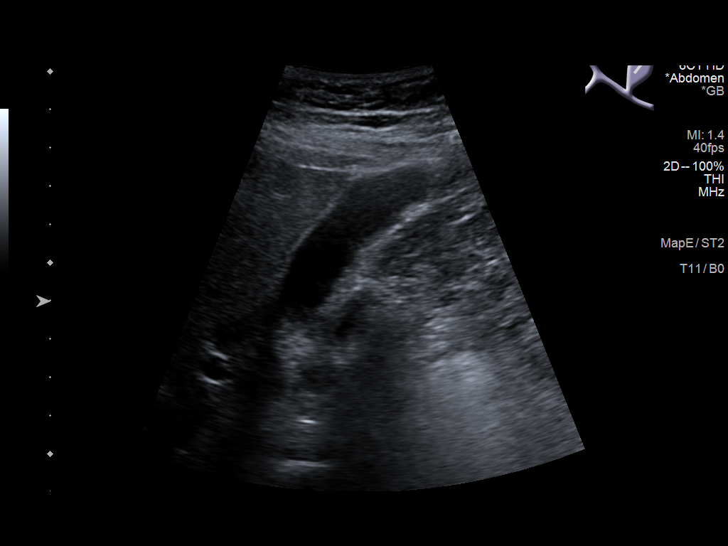
[im 21/125]
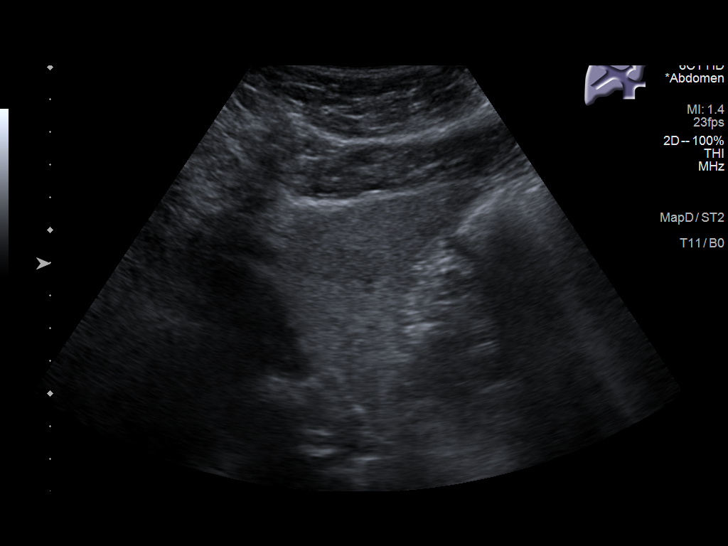
[im 32/125]
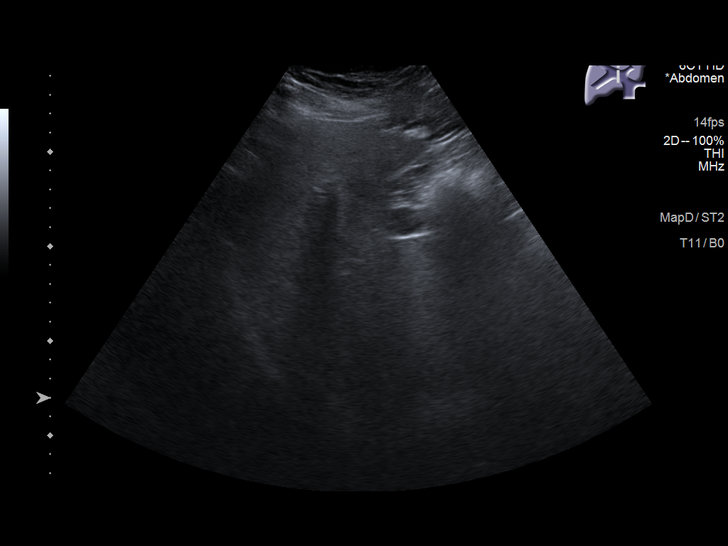
[im 42/125]
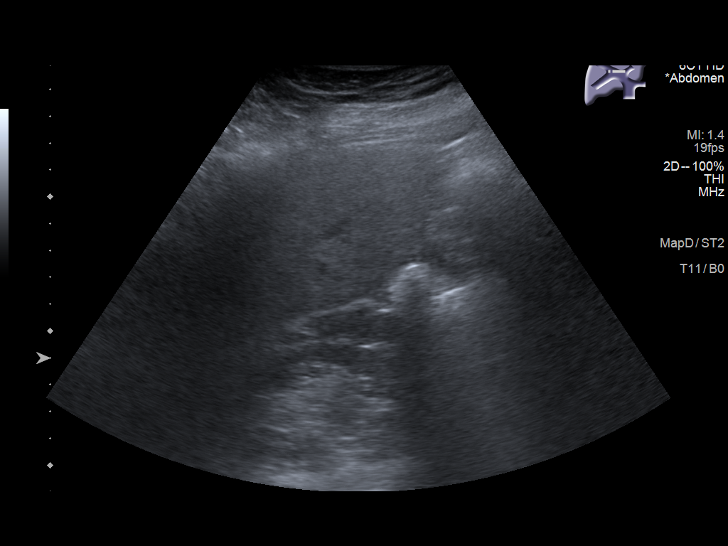
[im 47/125]
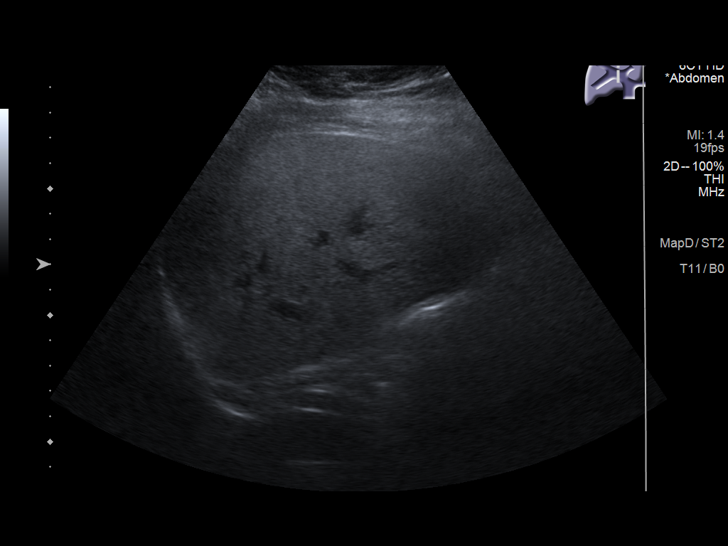
[im 57/125]
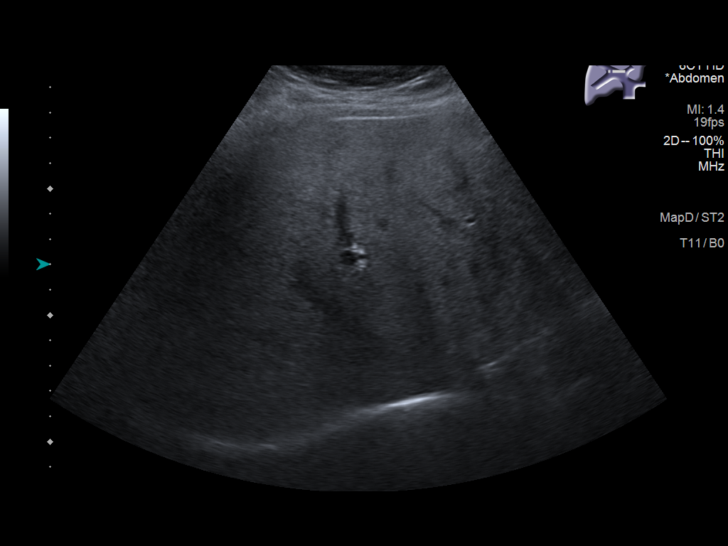
[im 68/125]
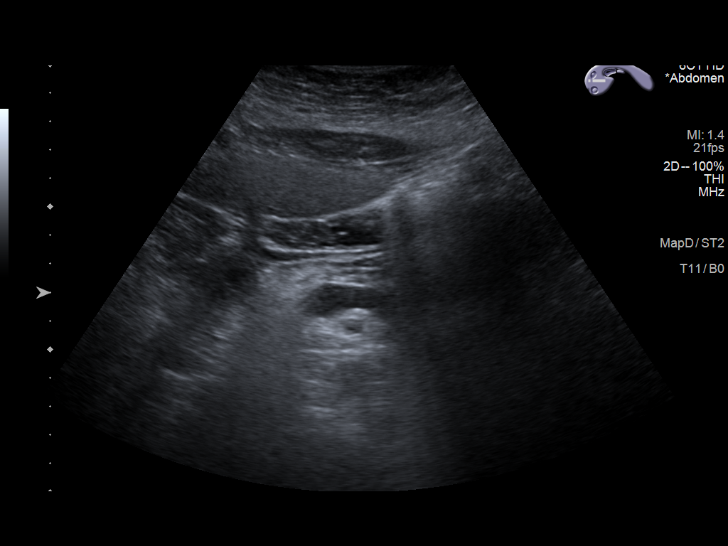
[im 78/125]
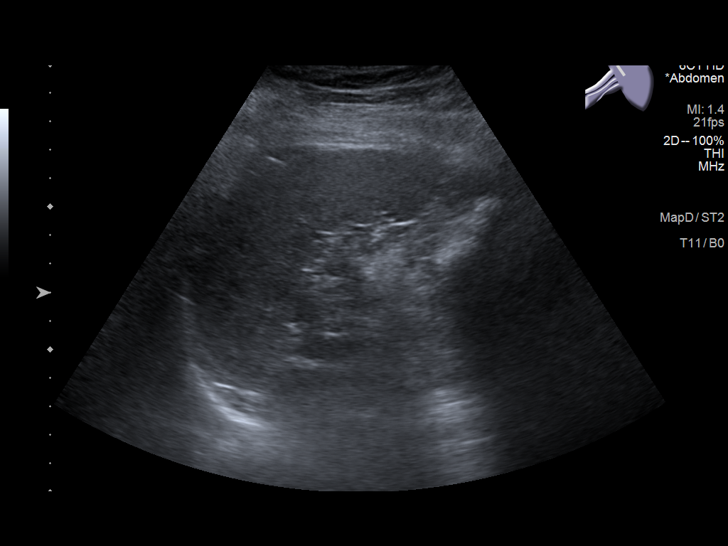
[im 83/125]
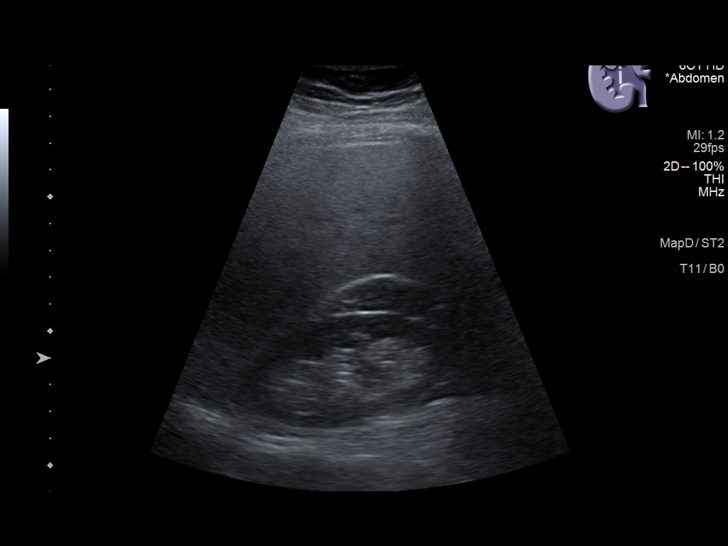
[im 94/125]
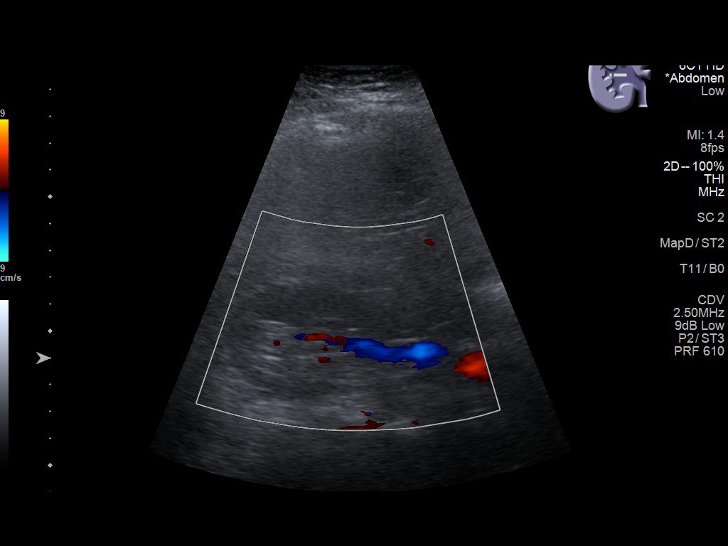
[im 104/125]
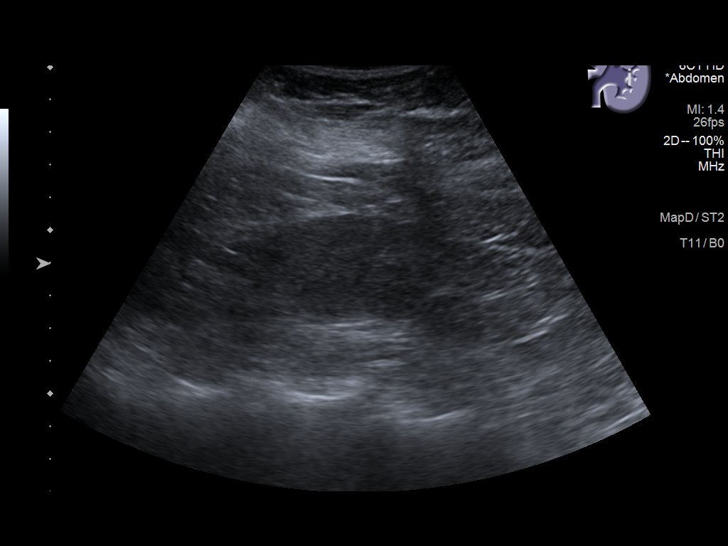
[im 114/125]
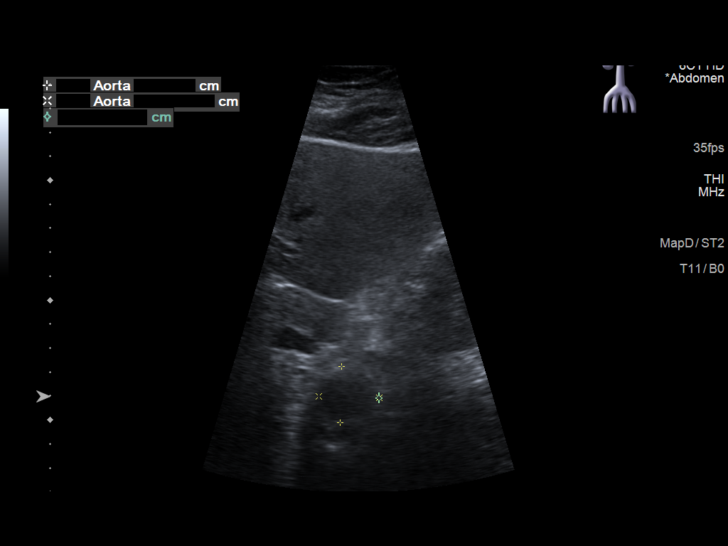
[im 125/125]
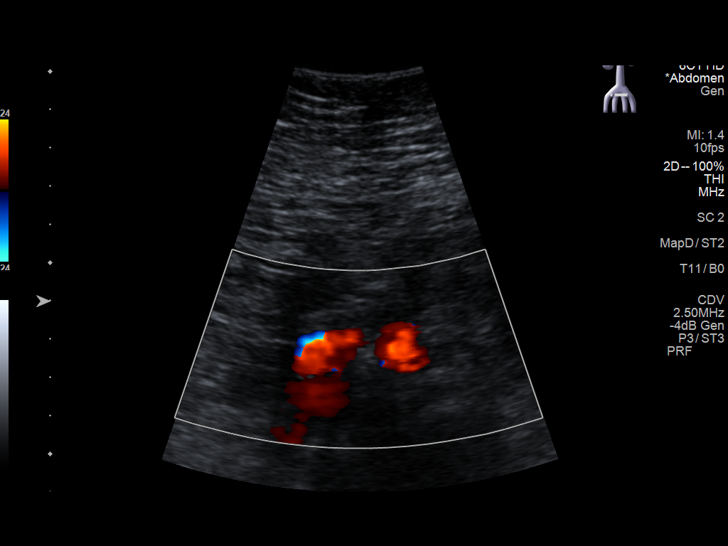

[14 of 25 positions shown; findings below may reference images not displayed]

FINDINGS: Gallbladder: Normally distended without stones or wall thickening.
No pericholecystic fluid or sonographic Murphy sign.

Common bile duct: Diameter: 2 mm, normal

Liver: Echogenic parenchyma, likely fatty infiltration though this
can be seen with cirrhosis and certain infiltrative disorders. No
hepatic mass or nodularity. Portal vein is patent on color Doppler
imaging with normal direction of blood flow towards the liver.

IVC: Normal appearance

Pancreas: Normal appearance

Spleen: Normal appearance, 8.3 cm length

Right Kidney: Length: 9.1 cm. Cortical thinning. Normal cortical
echogenicity. No mass or hydronephrosis.

Left Kidney: Length: 9.9 cm. Cortical thinning. Normal cortical
echogenicity. No mass or hydronephrosis.

Abdominal aorta: Normal caliber

Other findings: No free fluid
IMPRESSION: Probable fatty infiltration of liver as above.

BILATERAL renal cortical thinning.

Otherwise negative exam.

## 2021-01-02 ENCOUNTER — Other Ambulatory Visit: Payer: Self-pay | Admitting: Podiatry

## 2021-01-12 ENCOUNTER — Other Ambulatory Visit: Payer: Medicare Other

## 2021-01-14 ENCOUNTER — Ambulatory Visit: Admit: 2021-01-14 | Payer: Medicare Other | Admitting: Podiatry

## 2021-01-14 SURGERY — EXCISION MASS
Anesthesia: Choice | Laterality: Right

## 2021-05-07 ENCOUNTER — Other Ambulatory Visit: Payer: Self-pay | Admitting: Physician Assistant

## 2021-05-07 DIAGNOSIS — Z1231 Encounter for screening mammogram for malignant neoplasm of breast: Secondary | ICD-10-CM

## 2022-11-25 ENCOUNTER — Telehealth: Payer: Self-pay | Admitting: Gastroenterology

## 2022-11-25 ENCOUNTER — Other Ambulatory Visit: Payer: Self-pay | Admitting: *Deleted

## 2022-11-25 DIAGNOSIS — Z86010 Personal history of colonic polyps: Secondary | ICD-10-CM

## 2022-11-25 MED ORDER — PEG 3350-KCL-NABCB-NACL-NASULF 236 G PO SOLR: 4000.0000 mL | Freq: Once | ORAL | 0 refills | Status: AC

## 2022-11-25 NOTE — Telephone Encounter (Signed)
Gastroenterology Pre-Procedure Review  Request Date: 01/24/2023 Requesting Physician: Dr. Marius Ditch  PATIENT REVIEW QUESTIONS: The patient responded to the following health history questions as indicated:    1. Are you having any GI issues? no 2. Do you have a personal history of Polyps? yes (01/18/2020) 3. Do you have a family history of Colon Cancer or Polyps? no 4. Diabetes Mellitus? no 5. Joint replacements in the past 12 months?no 6. Major health problems in the past 3 months?no 7. Any artificial heart valves, MVP, or defibrillator?no    MEDICATIONS & ALLERGIES:    Patient reports the following regarding taking any anticoagulation/antiplatelet therapy:   Plavix, Coumadin, Eliquis, Xarelto, Lovenox, Pradaxa, Brilinta, or Effient? no Aspirin? no  Patient confirms/reports the following medications:  Current Outpatient Medications  Medication Sig Dispense Refill   polyethylene glycol (GOLYTELY) 236 g solution Take 4,000 mLs by mouth once for 1 dose. 4000 mL 0   b complex vitamins tablet Take 1 tablet by mouth daily.     No current facility-administered medications for this visit.    Patient confirms/reports the following allergies:  No Known Allergies  Orders Placed This Encounter  Procedures   Ambulatory referral to Gastroenterology    Referral Priority:   Routine    Referral Type:   Consultation    Referral Reason:   Specialty Services Required    Referred to Provider:   Lin Landsman, MD    Number of Visits Requested:   1    AUTHORIZATION INFORMATION Primary Insurance: 1D#: Group #:  Secondary Insurance: 1D#: Group #:  SCHEDULE INFORMATION: Date: 01/24/2023 Time: Location: [facility]

## 2022-11-25 NOTE — Telephone Encounter (Signed)
Spoken to patient. Colonoscopy have been schedule for 01/24/2023 as requested.

## 2022-11-25 NOTE — Addendum Note (Signed)
Addended by: Jacqualin Combes on: 11/25/2022 10:37 AM   Modules accepted: Orders

## 2022-11-25 NOTE — Telephone Encounter (Signed)
Patient calling to schedule repeat colonoscopy. Please return patients call.

## 2023-01-07 ENCOUNTER — Other Ambulatory Visit: Payer: Self-pay | Admitting: Family Medicine

## 2023-01-07 DIAGNOSIS — Z1231 Encounter for screening mammogram for malignant neoplasm of breast: Secondary | ICD-10-CM

## 2023-01-21 ENCOUNTER — Encounter: Payer: Self-pay | Admitting: Gastroenterology

## 2023-01-24 ENCOUNTER — Other Ambulatory Visit: Payer: Self-pay

## 2023-01-24 ENCOUNTER — Encounter
Admission: RE | Disposition: A | Discharge status: Home / Self Care | Payer: Self-pay | Source: Home / Self Care | Attending: Gastroenterology

## 2023-01-24 ENCOUNTER — Ambulatory Visit: Payer: Medicare Other | Admitting: Certified Registered"

## 2023-01-24 ENCOUNTER — Ambulatory Visit
Admission: RE | Admit: 2023-01-24 | Discharge: 2023-01-24 | Disposition: A | Discharge status: Home / Self Care | Payer: Medicare Other | Attending: Gastroenterology | Admitting: Gastroenterology

## 2023-01-24 ENCOUNTER — Encounter: Payer: Self-pay | Admitting: Gastroenterology

## 2023-01-24 DIAGNOSIS — Z8249 Family history of ischemic heart disease and other diseases of the circulatory system: Secondary | ICD-10-CM | POA: Insufficient documentation

## 2023-01-24 DIAGNOSIS — Z1211 Encounter for screening for malignant neoplasm of colon: Secondary | ICD-10-CM | POA: Diagnosis not present

## 2023-01-24 DIAGNOSIS — I509 Heart failure, unspecified: Secondary | ICD-10-CM | POA: Diagnosis not present

## 2023-01-24 DIAGNOSIS — Z860100 Personal history of colon polyps, unspecified: Secondary | ICD-10-CM

## 2023-01-24 DIAGNOSIS — K621 Rectal polyp: Secondary | ICD-10-CM | POA: Diagnosis not present

## 2023-01-24 HISTORY — PX: COLONOSCOPY WITH PROPOFOL: SHX5780

## 2023-01-24 SURGERY — COLONOSCOPY WITH PROPOFOL
Anesthesia: General

## 2023-01-24 MED ORDER — PROPOFOL 500 MG/50ML IV EMUL
INTRAVENOUS | Status: DC | PRN
  Administered 2023-01-24: 150 ug/kg/min via INTRAVENOUS

## 2023-01-24 MED ORDER — PROPOFOL 10 MG/ML IV BOLUS
INTRAVENOUS | Status: DC | PRN
  Administered 2023-01-24: 70 mg via INTRAVENOUS

## 2023-01-24 MED ORDER — SODIUM CHLORIDE 0.9 % IV SOLN: INTRAVENOUS | Status: DC

## 2023-01-24 MED ORDER — LIDOCAINE HCL (CARDIAC) PF 100 MG/5ML IV SOSY
PREFILLED_SYRINGE | INTRAVENOUS | Status: DC | PRN
  Administered 2023-01-24: 60 mg via INTRAVENOUS

## 2023-01-24 NOTE — Op Note (Signed)
[facility] Gastroenterology Patient Name: Erin Padilla Procedure Date: 01/24/2023 8:57 AM MRN: [id number] Account #: [id number] Date of Birth: 09/10/1956 Admit Type: Outpatient Age: 67 Room: [facility] ENDO ROOM 4 Gender: Female Note Status: Finalized Instrument Name: Park Meo [id number] Procedure:             Colonoscopy Indications:           Surveillance: Personal history of adenomatous polyps                         on last colonoscopy 3 years ago, Last colonoscopy:                         March 2021 Providers:             Lin Landsman MD, MD Medicines:             General Anesthesia Complications:         No immediate complications. Estimated blood loss: None. Procedure:             Pre-Anesthesia Assessment:                        - Prior to the procedure, a History and Physical was                         performed, and patient medications and allergies were                         reviewed. The patient is competent. The risks and                         benefits of the procedure and the sedation options and                         risks were discussed with the patient. All questions                         were answered and informed consent was obtained.                         Patient identification and proposed procedure were                         verified by the physician, the nurse, the                         anesthesiologist, the anesthetist and the technician                         in the pre-procedure area in the procedure room in the                         endoscopy suite. Mental Status Examination: alert and                         oriented. Airway Examination: normal oropharyngeal                         airway and neck mobility.  Respiratory Examination:                         clear to auscultation. CV Examination: normal.                         Prophylactic Antibiotics: The patient does not require                         prophylactic  antibiotics. Prior Anticoagulants: The                         patient has taken no anticoagulant or antiplatelet                         agents. ASA Grade Assessment: II - A patient with mild                         systemic disease. After reviewing the risks and                         benefits, the patient was deemed in satisfactory                         condition to undergo the procedure. The anesthesia                         plan was to use general anesthesia. Immediately prior                         to administration of medications, the patient was                         re-assessed for adequacy to receive sedatives. The                         heart rate, respiratory rate, oxygen saturations,                         blood pressure, adequacy of pulmonary ventilation, and                         response to care were monitored throughout the                         procedure. The physical status of the patient was                         re-assessed after the procedure.                        After obtaining informed consent, the colonoscope was                         passed under direct vision. Throughout the procedure,                         the patient's blood pressure, pulse, and oxygen  saturations were monitored continuously. The                         Colonoscope was introduced through the anus and                         advanced to the the cecum, identified by appendiceal                         orifice and ileocecal valve. The colonoscopy was                         performed without difficulty. The patient tolerated                         the procedure well. The quality of the bowel                         preparation was evaluated using the BBPS [facility] Bowel                         Preparation Scale) with scores of: Right Colon = 3,                         Transverse Colon = 3 and Left Colon = 3 (entire mucosa                         seen  well with no residual staining, small fragments                         of stool or opaque liquid). The total BBPS score                         equals 9. The ileocecal valve, appendiceal orifice,                         and rectum were photographed. Findings:      The perianal and digital rectal examinations were normal. Pertinent       negatives include normal sphincter tone and no palpable rectal lesions.      A diminutive polyp was found in the distal rectum. The polyp was       sessile. The polyp was removed with a cold biopsy forceps. Resection and       retrieval were complete. Estimated blood loss: none.      The retroflexed view of the distal rectum and anal verge was normal and       showed no anal or rectal abnormalities.      The exam was otherwise without abnormality. Impression:            - One diminutive polyp in the distal rectum, removed                         with a cold biopsy forceps. Resected and retrieved.                        - The distal rectum and anal verge are normal on  retroflexion view.                        - The examination was otherwise normal. Recommendation:        - Discharge patient to home (with escort).                        - Resume previous diet today.                        - Continue present medications.                        - Await pathology results.                        - Repeat colonoscopy in 7-10 years for surveillance                         based on pathology results. Procedure Code(s):     --- Professional ---                        [phone], Colonoscopy, flexible; with biopsy, single or                         multiple Diagnosis Code(s):     --- Professional ---                        Z86.010, Personal history of colonic polyps                        D12.8, Benign neoplasm of rectum CPT copyright 2022 American [employer]. All rights reserved. The codes documented in this report are preliminary and  upon coder review may  be revised to meet current compliance requirements. Dr. Ulyess Mort Lin Landsman MD, MD 01/24/2023 9:33:42 AM This report has been signed electronically. Number of Addenda: 0 Note Initiated On: 01/24/2023 8:57 AM Scope Withdrawal Time: 0 hours 11 minutes 1 second  Total Procedure Duration: 0 hours 16 minutes 32 seconds  Estimated Blood Loss:  Estimated blood loss: none.      [facility]

## 2023-01-24 NOTE — Transfer of Care (Signed)
Immediate Anesthesia Transfer of Care Note  Patient: Erin Padilla  Procedure(s) Performed: COLONOSCOPY WITH PROPOFOL  Patient Location: PACU and Endoscopy Unit  Anesthesia Type:General  Level of Consciousness: drowsy  Airway & Oxygen Therapy: Patient Spontanous Breathing  Post-op Assessment: Report given to RN and Post -op Vital signs reviewed and stable  Post vital signs: Reviewed and stable  Last Vitals:  Vitals Value Taken Time  BP 86/54 01/24/23 0936  Temp    Pulse 63 01/24/23 0936  Resp 15 01/24/23 0937  SpO2 98 % 01/24/23 0936  Vitals shown include unvalidated device data.  Last Pain:  Vitals:   01/24/23 0839  TempSrc: Temporal  PainSc: 0-No pain         Complications: No notable events documented.

## 2023-01-24 NOTE — Anesthesia Postprocedure Evaluation (Signed)
Anesthesia Post Note  Patient: Samoya Hochman  Procedure(s) Performed: COLONOSCOPY WITH PROPOFOL  Patient location during evaluation: Endoscopy Anesthesia Type: General Level of consciousness: awake and alert Pain management: pain level controlled Vital Signs Assessment: post-procedure vital signs reviewed and stable Respiratory status: spontaneous breathing, nonlabored ventilation, respiratory function stable and patient connected to nasal cannula oxygen Cardiovascular status: blood pressure returned to baseline and stable Postop Assessment: no apparent nausea or vomiting Anesthetic complications: no   No notable events documented.   Last Vitals:  Vitals:   01/24/23 0839 01/24/23 0935  BP: (!) 153/68 (!) 86/54  Pulse: 74 63  Resp: 16 13  Temp: (!) 36.1 C 36.4 C  SpO2: 100% 98%    Last Pain:  Vitals:   01/24/23 0955  TempSrc:   PainSc: 0-No pain                 Arita Miss

## 2023-01-24 NOTE — H&P (Signed)
  Cephas Darby, MD [address]  [place]  [place], [place] [address]  Main: [phone]  Fax: [phone] Pager: [phone]  Primary Care Physician:  Laneta Simmers, NP (Inactive) Primary Gastroenterologist:  Dr. Cephas Darby  Pre-Procedure History & Physical: HPI:  Erin Padilla is a 67 y.o. female is here for an colonoscopy.   Past Medical History:  Diagnosis Date   Colon polyp 2010   LBBB (left bundle branch block) 2012   LBBB (left bundle branch block)    Uterine cancer ([facility])    did not penetrate wall    Past Surgical History:  Procedure Laterality Date   ABDOMINAL HYSTERECTOMY  2012   COLONOSCOPY WITH PROPOFOL N/A 01/18/2020   Procedure: COLONOSCOPY WITH PROPOFOL;  Surgeon: Lin Landsman, MD;  Location: [facility] ENDOSCOPY;  Service: Gastroenterology;  Laterality: N/A;   TONSILLECTOMY  1970    Prior to Admission medications   Medication Sig Start Date End Date Taking? Authorizing Provider  b complex vitamins tablet Take 1 tablet by mouth daily.    [provider]    Allergies as of 11/25/2022   (No Known Allergies)    Family History  Problem Relation Age of Onset   Arrhythmia Father    Hypertension Father     Social History   Socioeconomic History   Marital status: Widowed    Spouse name: Not on file   Number of children: Not on file   Years of education: Not on file   Highest education level: Not on file  Occupational History   Not on file  Tobacco Use   Smoking status: Never   Smokeless tobacco: Never  Vaping Use   Vaping Use: Never used  Substance and Sexual Activity   Alcohol use: No   Drug use: No   Sexual activity: Not on file  Other Topics Concern   Not on file  Social History Narrative   Galaxy Vickery, RN is patient's daughter in Sports coach.   Married.   Never smoked. No EtOH.   Prior to worsening Knee pain from OA - would walk & exercise regularly. 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    Social  Determinants of Health   Financial Resource Strain: Not on file  Food Insecurity: Not on file  Transportation Needs: Not on file  Physical Activity: Not on file  Stress: Not on file  Social Connections: Not on file  Intimate Partner Violence: Not on file    Review of Systems: See HPI, otherwise negative ROS  Physical Exam: BP (!) 153/68   Pulse 74   Temp (!) 97 F (36.1 C) (Temporal)   Resp 16   Ht 5\' 3"  (1.6 m)   Wt 69.9 kg   SpO2 100%   BMI 27.28 kg/m  General:   Alert,  pleasant and cooperative in NAD Head:  Normocephalic and atraumatic. Neck:  Supple; no masses or thyromegaly. Lungs:  Clear throughout to auscultation.    Heart:  Regular rate and rhythm. Abdomen:  Soft, nontender and nondistended. Normal bowel sounds, without guarding, and without rebound.   Neurologic:  Alert and  oriented x4;  grossly normal neurologically.  Impression/Plan: Erin Padilla is here for an colonoscopy to be performed for h/o colon adenoma  Risks, benefits, limitations, and alternatives regarding  colonoscopy have been reviewed with the patient.  Questions have been answered.  All parties agreeable.   Sherri Sear, MD  01/24/2023, 8:58 AM

## 2023-01-24 NOTE — Anesthesia Preprocedure Evaluation (Addendum)
Anesthesia Evaluation  Patient identified by MRN, date of birth, ID band Patient awake    Reviewed: Allergy & Precautions, NPO status , Patient's Chart, lab work & pertinent test results  History of Anesthesia Complications Negative for: history of anesthetic complications  Airway Mallampati: II  TM Distance: >3 FB Neck ROM: Full    Dental no notable dental hx. (+) Teeth Intact   Pulmonary neg pulmonary ROS, neg sleep apnea, neg COPD, Patient abstained from smoking.Not current smoker   Pulmonary exam normal breath sounds clear to auscultation       Cardiovascular Exercise Tolerance: Good METS(-) hypertension+CHF  (-) CAD and (-) Past MI + dysrhythmias  Rhythm:Regular Rate:Normal - Systolic murmurs TTE [id number]; INTERPRETATION  MILD LV SYSTOLIC DYSFUNCTION (See above)  NORMAL RIGHT VENTRICULAR SYSTOLIC FUNCTION  TRIVIAL REGURGITATION NOTED (See above)  NO VALVULAR STENOSIS  TRIVIAL MR, TR, PR  EF 45%     Neuro/Psych negative neurological ROS  negative psych ROS   GI/Hepatic ,neg GERD  ,,(+)     (-) substance abuse    Endo/Other  neg diabetes    Renal/GU negative Renal ROS     Musculoskeletal   Abdominal   Peds  Hematology   Anesthesia Other Findings Past Medical History: 2010: Colon polyp 2012: LBBB (left bundle branch block) No date: LBBB (left bundle branch block) No date: Uterine cancer ([facility])     Comment:  did not penetrate wall  Reproductive/Obstetrics                             Anesthesia Physical Anesthesia Plan  ASA: 2  Anesthesia Plan: General   Post-op Pain Management: Minimal or no pain anticipated   Induction: Intravenous  PONV Risk Score and Plan: 3 and Propofol infusion, TIVA and Ondansetron  Airway Management Planned: Nasal Cannula  Additional Equipment: None  Intra-op Plan:   Post-operative Plan:   Informed Consent: I have reviewed the patients  History and Physical, chart, labs and discussed the procedure including the risks, benefits and alternatives for the proposed anesthesia with the patient or authorized representative who has indicated his/her understanding and acceptance.     Dental advisory given  Plan Discussed with: CRNA and Surgeon  Anesthesia Plan Comments: (Discussed risks of anesthesia with patient, including possibility of difficulty with spontaneous ventilation under anesthesia necessitating airway intervention, PONV, and rare risks such as cardiac or respiratory or neurological events, and allergic reactions. Discussed the role of CRNA in patient's perioperative care. Patient understands.)       Anesthesia Quick Evaluation

## 2023-01-25 ENCOUNTER — Encounter: Payer: Self-pay | Admitting: Gastroenterology

## 2023-01-25 LAB — SURGICAL PATHOLOGY
